# Patient Record
Sex: Female | Born: 1975 | Race: Black or African American | Hispanic: No | Marital: Married | Smoking: Never smoker
Health system: Southern US, Community
[De-identification: ages and names within clinical notes are randomized; demographics above are authoritative.]

## PROBLEM LIST (undated history)

## (undated) DIAGNOSIS — I10 Essential (primary) hypertension: Secondary | ICD-10-CM

## (undated) DIAGNOSIS — E559 Vitamin D deficiency, unspecified: Secondary | ICD-10-CM

## (undated) DIAGNOSIS — D171 Benign lipomatous neoplasm of skin and subcutaneous tissue of trunk: Secondary | ICD-10-CM

## (undated) DIAGNOSIS — D649 Anemia, unspecified: Secondary | ICD-10-CM

## (undated) DIAGNOSIS — K589 Irritable bowel syndrome without diarrhea: Secondary | ICD-10-CM

## (undated) DIAGNOSIS — Z6841 Body Mass Index (BMI) 40.0 and over, adult: Secondary | ICD-10-CM

## (undated) DIAGNOSIS — J45909 Unspecified asthma, uncomplicated: Secondary | ICD-10-CM

## (undated) DIAGNOSIS — K449 Diaphragmatic hernia without obstruction or gangrene: Secondary | ICD-10-CM

## (undated) DIAGNOSIS — K219 Gastro-esophageal reflux disease without esophagitis: Secondary | ICD-10-CM

## (undated) HISTORY — DX: Body Mass Index (BMI) 40.0 and over, adult: Z684

## (undated) HISTORY — PX: URETHRA SURGERY: SHX824

## (undated) HISTORY — DX: Benign lipomatous neoplasm of skin and subcutaneous tissue of trunk: D17.1

## (undated) HISTORY — DX: Body mass index (BMI) 45.0-49.9, adult: E66.01

## (undated) HISTORY — DX: Diaphragmatic hernia without obstruction or gangrene: K44.9

## (undated) HISTORY — DX: Essential (primary) hypertension: I10

## (undated) HISTORY — DX: Irritable bowel syndrome without diarrhea: K58.9

## (undated) HISTORY — DX: Gastro-esophageal reflux disease without esophagitis: K21.9

## (undated) HISTORY — PX: TUBAL LIGATION: SHX77

## (undated) HISTORY — DX: Unspecified asthma, uncomplicated: J45.909

## (undated) HISTORY — DX: Vitamin D deficiency, unspecified: E55.9

## (undated) HISTORY — DX: Anemia, unspecified: D64.9

## (undated) HISTORY — PX: GALLBLADDER SURGERY: SHX652

---

## 2012-05-13 ENCOUNTER — Encounter: Payer: Self-pay | Admitting: Obstetrics & Gynecology

## 2012-05-13 ENCOUNTER — Ambulatory Visit (INDEPENDENT_AMBULATORY_CARE_PROVIDER_SITE_OTHER): Payer: BC Managed Care – PPO | Admitting: Obstetrics & Gynecology

## 2012-05-13 VITALS — BP 130/96 | HR 85 | Temp 98.6°F | Resp 16 | Ht 66.5 in | Wt 314.0 lb

## 2012-05-13 DIAGNOSIS — Z01419 Encounter for gynecological examination (general) (routine) without abnormal findings: Secondary | ICD-10-CM

## 2012-05-13 DIAGNOSIS — Z0000 Encounter for general adult medical examination without abnormal findings: Secondary | ICD-10-CM

## 2012-05-13 DIAGNOSIS — Z1151 Encounter for screening for human papillomavirus (HPV): Secondary | ICD-10-CM

## 2012-05-13 DIAGNOSIS — Z113 Encounter for screening for infections with a predominantly sexual mode of transmission: Secondary | ICD-10-CM

## 2012-05-13 DIAGNOSIS — Z124 Encounter for screening for malignant neoplasm of cervix: Secondary | ICD-10-CM

## 2012-05-13 LAB — CBC
Hemoglobin: 12.2 g/dL (ref 12.0–15.0)
MCH: 29.3 pg (ref 26.0–34.0)
Platelets: 347 10*3/uL (ref 150–400)
RBC: 4.17 MIL/uL (ref 3.87–5.11)
WBC: 8.4 10*3/uL (ref 4.0–10.5)
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
## 2012-05-14 ENCOUNTER — Telehealth: Payer: Self-pay | Admitting: *Deleted

## 2012-05-14 DIAGNOSIS — B9689 Other specified bacterial agents as the cause of diseases classified elsewhere: Secondary | ICD-10-CM

## 2012-05-14 LAB — LIPID PANEL
LDL Cholesterol: 100 mg/dL — ABNORMAL HIGH (ref 0–99)
Triglycerides: 68 mg/dL (ref <150)
VLDL: 14 mg/dL (ref 0–40)

## 2012-05-14 LAB — HSV 2 ANTIBODY, IGG: HSV 2 Glycoprotein G Ab, IgG: 5.26 IV — ABNORMAL HIGH

## 2012-05-14 LAB — COMPREHENSIVE METABOLIC PANEL
ALT: 9 U/L (ref 0–35)
Albumin: 4 g/dL (ref 3.5–5.2)
CO2: 29 mEq/L (ref 19–32)
Calcium: 9 mg/dL (ref 8.4–10.5)
Chloride: 102 mEq/L (ref 96–112)
Glucose, Bld: 84 mg/dL (ref 70–99)
Potassium: 3.8 mEq/L (ref 3.5–5.3)
Sodium: 139 mEq/L (ref 135–145)
Total Bilirubin: 0.3 mg/dL (ref 0.3–1.2)
Total Protein: 7 g/dL (ref 6.0–8.3)

## 2012-05-14 LAB — HEPATITIS C ANTIBODY: HCV Ab: NEGATIVE

## 2012-05-14 LAB — RPR

## 2012-05-14 LAB — HEPATITIS B SURFACE ANTIGEN: Hepatitis B Surface Ag: NEGATIVE

## 2012-05-14 LAB — WET PREP, GENITAL: Trich, Wet Prep: NONE SEEN

## 2012-05-14 MED ORDER — METRONIDAZOLE 500 MG PO TABS: 500.0000 mg | ORAL_TABLET | Freq: Two times a day (BID) | ORAL | Status: DC

## 2012-05-14 NOTE — Telephone Encounter (Signed)
Pt notified of positive clue cells and she did have c/o's of fishy vaginal odor.  RX for Flagyl 500 mg BID x 7 days [facility].

## 2012-06-01 ENCOUNTER — Encounter: Payer: Self-pay | Admitting: Obstetrics & Gynecology

## 2012-06-09 ENCOUNTER — Ambulatory Visit (INDEPENDENT_AMBULATORY_CARE_PROVIDER_SITE_OTHER): Payer: BC Managed Care – PPO | Admitting: Obstetrics & Gynecology

## 2012-06-09 ENCOUNTER — Encounter: Payer: Self-pay | Admitting: Obstetrics & Gynecology

## 2012-06-09 VITALS — BP 135/91 | HR 84 | Resp 17

## 2012-06-09 DIAGNOSIS — B009 Herpesviral infection, unspecified: Secondary | ICD-10-CM

## 2012-06-09 MED ORDER — FLUCONAZOLE 150 MG PO TABS: 150.0000 mg | ORAL_TABLET | Freq: Once | ORAL | Status: DC

## 2012-06-09 MED ORDER — VALACYCLOVIR HCL 1 G PO TABS: 1000.0000 mg | ORAL_TABLET | Freq: Every day | ORAL | Status: DC

## 2012-06-09 NOTE — Progress Notes (Signed)
  Subjective:    Patient ID: Carmen Davis, female    DOB: 04/02/1975, 37 y.o.   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 We discussed HSV. She opts for valtrex to use on a prn basis. I gave her diflucan based on her pap.  Review of Systems     Objective:   Physical Exam        Assessment & Plan:  RTC 1 year/prn sooner

## 2012-06-10 ENCOUNTER — Ambulatory Visit: Payer: BC Managed Care – PPO | Admitting: Obstetrics & Gynecology

## 2013-01-21 ENCOUNTER — Other Ambulatory Visit: Payer: Self-pay

## 2014-01-17 ENCOUNTER — Encounter: Payer: Self-pay | Admitting: Obstetrics & Gynecology

## 2014-07-26 ENCOUNTER — Ambulatory Visit (INDEPENDENT_AMBULATORY_CARE_PROVIDER_SITE_OTHER): Payer: 59

## 2014-07-26 ENCOUNTER — Ambulatory Visit (INDEPENDENT_AMBULATORY_CARE_PROVIDER_SITE_OTHER): Payer: 59 | Admitting: Obstetrics & Gynecology

## 2014-07-26 ENCOUNTER — Encounter: Payer: Self-pay | Admitting: Obstetrics & Gynecology

## 2014-07-26 VITALS — BP 138/97 | HR 91 | Ht 65.0 in | Wt 361.0 lb

## 2014-07-26 DIAGNOSIS — N393 Stress incontinence (female) (male): Secondary | ICD-10-CM

## 2014-07-26 DIAGNOSIS — Z0000 Encounter for general adult medical examination without abnormal findings: Secondary | ICD-10-CM

## 2014-07-26 DIAGNOSIS — Z01419 Encounter for gynecological examination (general) (routine) without abnormal findings: Secondary | ICD-10-CM | POA: Diagnosis not present

## 2014-07-26 DIAGNOSIS — Z124 Encounter for screening for malignant neoplasm of cervix: Secondary | ICD-10-CM | POA: Diagnosis not present

## 2014-07-26 DIAGNOSIS — R102 Pelvic and perineal pain: Secondary | ICD-10-CM

## 2014-07-26 DIAGNOSIS — Z1151 Encounter for screening for human papillomavirus (HPV): Secondary | ICD-10-CM

## 2014-07-26 MED ORDER — VALACYCLOVIR HCL 1 G PO TABS: 1000.0000 mg | ORAL_TABLET | Freq: Every day | ORAL | Status: DC

## 2014-07-26 NOTE — Progress Notes (Signed)
Subjective:    Carmen Davis is a 39 y.o. S AA P4  female who presents for an annual exam. The patient has no complaints today except for LmidQ pain for the last 5 months. She has tried Tylenol but this has not helped. IBU helps a little bit. She has not had sex for 2 years.  The patient is not currently sexually active. GYN screening history: last pap: was normal. The patient wears seatbelts: yes. The patient participates in regular exercise: yes. Has the patient ever been transfused or tattooed?: no. The patient reports that there is not domestic violence in her life.   Menstrual History: OB History    Gravida Para Term Preterm AB TAB SAB Ectopic Multiple Living   _0 Menarche age: 50  Patient's last menstrual period was 07/06/2014.    The following portions of the patient's history were reviewed and updated as appropriate: allergies, current medications, past family history, past medical history, past social history, past surgical history and problem list.  Review of Systems A comprehensive review of systems was negative. She works as a Visual merchandiser. Her mother had breast cancer at age 80 but no BRCA testing. She reports a normal mammogram recently. She reports normal BMs. She has some difficulty "holding my urine".   Objective:    BP 138/97 mmHg  Pulse 91  Ht _1  (1.651 m)  Wt 361 lb (163.749 kg)  BMI 60.07 kg/m2  LMP 07/06/2014  General Appearance:    Alert, cooperative, no distress, appears stated age  Head:    Normocephalic, without obvious abnormality, atraumatic  Eyes:    PERRL, conjunctiva/corneas clear, EOM's intact, fundi    benign, both eyes  Ears:    Normal TM's and external ear canals, both ears  Nose:   Nares normal, septum midline, mucosa normal, no drainage    or sinus tenderness  Throat:   Lips, mucosa, and tongue normal; teeth and gums normal  Neck:   Supple, symmetrical, trachea midline, no adenopathy;    thyroid:  no  enlargement/tenderness/nodules; no carotid   bruit or JVD  Back:     Symmetric, no curvature, ROM normal, no CVA tenderness  Lungs:     Clear to auscultation bilaterally, respirations unlabored  Chest Wall:    No tenderness or deformity   Heart:    Regular rate and rhythm, S1 and S2 normal, no murmur, rub   or gallop  Breast Exam:    No tenderness, masses, or nipple abnormality  Abdomen:     Soft, non-tender, bowel sounds active all four quadrants,    no masses, no organomegaly  Genitalia:    Normal female without lesion, discharge or tenderness, no masses palpable     Extremities:   Extremities normal, atraumatic, no cyanosis or edema  Pulses:   2+ and symmetric all extremities  Skin:   Skin color, texture, turgor normal, no rashes or lesions  Lymph nodes:   Cervical, supraclavicular, and axillary nodes normal  Neurologic:   CNII-XII intact, normal strength, sensation and reflexes    throughout  .    Assessment:    Healthy female exam.   UI L mid quadrant pain   Plan:     Breast self exam technique reviewed and patient encouraged to perform self-exam monthly. Thin prep Pap smear.   Urology referral for urinary incontinence Gyn u/s

## 2014-07-28 LAB — CYTOLOGY - PAP

## 2014-07-29 ENCOUNTER — Encounter: Payer: Self-pay | Admitting: Obstetrics & Gynecology

## 2014-08-01 ENCOUNTER — Telehealth: Payer: Self-pay | Admitting: *Deleted

## 2014-08-01 DIAGNOSIS — B373 Candidiasis of vulva and vagina: Secondary | ICD-10-CM

## 2014-08-01 DIAGNOSIS — B3731 Acute candidiasis of vulva and vagina: Secondary | ICD-10-CM

## 2014-08-01 MED ORDER — FLUCONAZOLE 150 MG PO TABS: 150.0000 mg | ORAL_TABLET | Freq: Once | ORAL | Status: DC

## 2014-08-01 NOTE — Telephone Encounter (Signed)
Pt notified of normal pap except for candida and RX sent to her pharmacy for that.  Her TVU  Showed normal ultrasound other than the LO was not  Discretely visualized.

## 2015-09-05 ENCOUNTER — Encounter: Payer: Self-pay | Admitting: Obstetrics & Gynecology

## 2015-09-05 ENCOUNTER — Ambulatory Visit (INDEPENDENT_AMBULATORY_CARE_PROVIDER_SITE_OTHER): Payer: BLUE CROSS/BLUE SHIELD | Admitting: Obstetrics & Gynecology

## 2015-09-05 VITALS — BP 145/92 | HR 95 | Resp 16 | Ht 65.5 in | Wt 364.0 lb

## 2015-09-05 DIAGNOSIS — Z01419 Encounter for gynecological examination (general) (routine) without abnormal findings: Secondary | ICD-10-CM

## 2015-09-05 DIAGNOSIS — Z113 Encounter for screening for infections with a predominantly sexual mode of transmission: Secondary | ICD-10-CM

## 2015-09-05 MED ORDER — VALACYCLOVIR HCL 1 G PO TABS: 1000.0000 mg | ORAL_TABLET | Freq: Every day | ORAL | Status: DC

## 2015-09-05 NOTE — Progress Notes (Signed)
Subjective:    Maddyson Dossantos is a 40 y.o. D AA P4 (4, 73, 48, and 45 yo kids)  female who presents for an annual exam. The patient has no complaints today. The patient is not currently sexually active. GYN screening history: last pap: was normal. The patient wears seatbelts: yes. The patient participates in regular exercise: yes. Has the patient ever been transfused or tattooed?: no. The patient reports that there is not domestic violence in her life.   Menstrual History: OB History    Gravida Para Term Preterm AB TAB SAB Ectopic Multiple Living   4 4 2 2      4       Menarche age: 89  Patient's last menstrual period was 08/27/2015.    The following portions of the patient's history were reviewed and updated as appropriate: allergies, current medications, past family history, past medical history, past social history, past surgical history and problem list.  Review of Systems Pertinent items are noted in HPI. Work in [employer] in [place]. Mammogram at [facility] last month and normal.   Objective:    BP 145/92 mmHg  Pulse 95  Resp 16  Ht 5' 5.5" (1.664 m)  Wt 364 lb (165.109 kg)  BMI 59.63 kg/m2  LMP 08/27/2015  General Appearance:    Alert, cooperative, no distress, appears stated age  Head:    Normocephalic, without obvious abnormality, atraumatic  Eyes:    PERRL, conjunctiva/corneas clear, EOM's intact, fundi    benign, both eyes  Ears:    Normal TM's and external ear canals, both ears  Nose:   Nares normal, septum midline, mucosa normal, no drainage    or sinus tenderness  Throat:   Lips, mucosa, and tongue normal; teeth and gums normal  Neck:   Supple, symmetrical, trachea midline, no adenopathy;    thyroid:  no enlargement/tenderness/nodules; no carotid   bruit or JVD  Back:     Symmetric, no curvature, ROM normal, no CVA tenderness  Lungs:     Clear to auscultation bilaterally, respirations unlabored  Chest Wall:    No tenderness or deformity   Heart:    Regular rate and rhythm, S1  and S2 normal, no murmur, rub   or gallop  Breast Exam:    No tenderness, masses, or nipple abnormality  Abdomen:     Soft, non-tender, bowel sounds active all four quadrants,    no masses, no organomegaly  Genitalia:    Normal female without lesion, discharge or tenderness, no palpable masses, her cervix and vagina appear normal     Extremities:   Extremities normal, atraumatic, no cyanosis or edema  Pulses:   2+ and symmetric all extremities  Skin:   Skin color, texture, turgor normal, no rashes or lesions  Lymph nodes:   Cervical, supraclavicular, and axillary nodes normal  Neurologic:   CNII-XII intact, normal strength, sensation and reflexes    throughout  .    Assessment:    Healthy female exam.    Plan:     refill valtrex

## 2015-09-06 ENCOUNTER — Other Ambulatory Visit (INDEPENDENT_AMBULATORY_CARE_PROVIDER_SITE_OTHER): Payer: BLUE CROSS/BLUE SHIELD

## 2015-09-06 DIAGNOSIS — Z113 Encounter for screening for infections with a predominantly sexual mode of transmission: Secondary | ICD-10-CM | POA: Diagnosis not present

## 2015-09-06 DIAGNOSIS — Z01419 Encounter for gynecological examination (general) (routine) without abnormal findings: Secondary | ICD-10-CM | POA: Diagnosis not present

## 2015-09-06 LAB — CBC
HEMATOCRIT: 38.3 % (ref 35.0–45.0)
HEMOGLOBIN: 12.2 g/dL (ref 11.7–15.5)
MCH: 28.6 pg (ref 27.0–33.0)
MCHC: 31.9 g/dL — AB (ref 32.0–36.0)
MCV: 89.7 fL (ref 80.0–100.0)
MPV: 9.8 fL (ref 7.5–12.5)
Platelets: 301 10*3/uL (ref 140–400)
RBC: 4.27 MIL/uL (ref 3.80–5.10)
RDW: 15.6 % — AB (ref 11.0–15.0)
WBC: 8.5 10*3/uL (ref 3.8–10.8)

## 2015-09-06 LAB — LIPID PANEL
CHOL/HDL RATIO: 3.1 ratio (ref <=5.0)
CHOLESTEROL: 157 mg/dL (ref 125–200)
HDL: 51 mg/dL (ref >=46)
LDL Cholesterol: 88 mg/dL (ref <130)
TRIGLYCERIDES: 92 mg/dL (ref <150)
VLDL: 18 mg/dL (ref <30)

## 2015-09-06 LAB — COMPREHENSIVE METABOLIC PANEL
ALBUMIN: 3.6 g/dL (ref 3.6–5.1)
ALK PHOS: 70 U/L (ref 33–115)
ALT: 12 U/L (ref 6–29)
AST: 11 U/L (ref 10–30)
BILIRUBIN TOTAL: 0.3 mg/dL (ref 0.2–1.2)
BUN: 9 mg/dL (ref 7–25)
CALCIUM: 8.8 mg/dL (ref 8.6–10.2)
CO2: 28 mmol/L (ref 20–31)
Chloride: 105 mmol/L (ref 98–110)
Creat: 0.65 mg/dL (ref 0.50–1.10)
GLUCOSE: 96 mg/dL (ref 65–99)
Potassium: 4.2 mmol/L (ref 3.5–5.3)
SODIUM: 141 mmol/L (ref 135–146)
Total Protein: 6.4 g/dL (ref 6.1–8.1)

## 2015-09-06 LAB — TSH: TSH: 2.62 mIU/L

## 2015-09-07 ENCOUNTER — Telehealth: Payer: Self-pay | Admitting: *Deleted

## 2015-09-07 DIAGNOSIS — R7989 Other specified abnormal findings of blood chemistry: Secondary | ICD-10-CM

## 2015-09-07 LAB — RPR

## 2015-09-07 LAB — HEPATITIS B SURFACE ANTIGEN: Hepatitis B Surface Ag: NEGATIVE

## 2015-09-07 LAB — GC/CHLAMYDIA PROBE AMP (~~LOC~~) NOT AT ARMC
CHLAMYDIA, DNA PROBE: NEGATIVE
NEISSERIA GONORRHEA: NEGATIVE

## 2015-09-07 LAB — HEPATITIS C ANTIBODY: HCV Ab: NEGATIVE

## 2015-09-07 LAB — VITAMIN D 25 HYDROXY (VIT D DEFICIENCY, FRACTURES): Vit D, 25-Hydroxy: 27 ng/mL — ABNORMAL LOW (ref 30–100)

## 2015-09-07 LAB — HIV ANTIBODY (ROUTINE TESTING W REFLEX): HIV 1&2 Ab, 4th Generation: NONREACTIVE

## 2015-09-07 MED ORDER — VITAMIN D (ERGOCALCIFEROL) 1.25 MG (50000 UNIT) PO CAPS: 50000.0000 [IU] | ORAL_CAPSULE | ORAL | Status: DC

## 2015-09-07 NOTE — Telephone Encounter (Signed)
-----   Message from Emily Filbert, MD sent at 09/07/2015 12:49 PM EDT ----- She will need 8 weeks of weekly Vitamin D 50,000 units and then a recheck of her level. Thanks

## 2015-09-07 NOTE — Telephone Encounter (Signed)
Pt noptified of labs and she does have low Vitamin D, so RX was sent to her pharmacy and she will return for rpt blood in 8 weeks.

## 2016-12-12 ENCOUNTER — Other Ambulatory Visit: Payer: Self-pay | Admitting: *Deleted

## 2016-12-12 DIAGNOSIS — B009 Herpesviral infection, unspecified: Secondary | ICD-10-CM

## 2016-12-12 MED ORDER — VALACYCLOVIR HCL 1 G PO TABS: 1000.0000 mg | ORAL_TABLET | Freq: Every day | ORAL | 1 refills | Status: DC

## 2016-12-12 NOTE — Telephone Encounter (Signed)
RF request received for Valacyclovir 1 GM tablets.  2 RF given and pharmacy notified to have pt call for appt as she is overdue for her annual.

## 2017-07-24 ENCOUNTER — Encounter: Payer: Self-pay | Admitting: Obstetrics & Gynecology

## 2017-07-24 ENCOUNTER — Ambulatory Visit (INDEPENDENT_AMBULATORY_CARE_PROVIDER_SITE_OTHER): Payer: BLUE CROSS/BLUE SHIELD | Admitting: Obstetrics & Gynecology

## 2017-07-24 VITALS — BP 152/93 | HR 99 | Ht 66.0 in | Wt 387.0 lb

## 2017-07-24 DIAGNOSIS — Z124 Encounter for screening for malignant neoplasm of cervix: Secondary | ICD-10-CM

## 2017-07-24 DIAGNOSIS — Z01419 Encounter for gynecological examination (general) (routine) without abnormal findings: Secondary | ICD-10-CM | POA: Diagnosis not present

## 2017-07-24 DIAGNOSIS — R350 Frequency of micturition: Secondary | ICD-10-CM | POA: Diagnosis not present

## 2017-07-24 DIAGNOSIS — Z1151 Encounter for screening for human papillomavirus (HPV): Secondary | ICD-10-CM | POA: Diagnosis not present

## 2017-07-24 DIAGNOSIS — N898 Other specified noninflammatory disorders of vagina: Secondary | ICD-10-CM

## 2017-07-24 DIAGNOSIS — N6001 Solitary cyst of right breast: Secondary | ICD-10-CM | POA: Diagnosis not present

## 2017-07-24 NOTE — Progress Notes (Signed)
Subjective:    Carmen Davis is a 42 y.o. divorced P4 (9, 33, 50, and 44 yo kids) female who presents for an annual exam. The patient has no complaints today. She thinks she may have a UTI, maybe yeast. She has a recurring inflamed cyst on her right breast.The patient is not currently sexually active. GYN screening history: last pap: was normal. The patient wears seatbelts: yes. The patient participates in regular exercise: yes. 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  Has the patient ever been transfused or tattooed?: no. The patient reports that there is not domestic violence in her life.   Menstrual History: OB History    Gravida  4   Para  4   Term  2   Preterm  2   AB      Living  4     SAB      TAB      Ectopic      Multiple      Live Births              Menarche age: 19 Patient's last menstrual period was 07/08/2017.    The following portions of the patient's history were reviewed and updated as appropriate: allergies, current medications, past family history, past medical history, past social history, past surgical history and problem list.  Review of Systems Pertinent items are noted in HPI.   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) Periods monthly   Objective:    BP (!) 152/93   Pulse 99   Ht 5\' 6"  (1.676 m)   Wt (!) 387 lb (175.5 kg)   LMP 07/08/2017   BMI 62.46 kg/m   General Appearance:    Alert, cooperative, no distress, appears stated age  Head:    Normocephalic, without obvious abnormality, atraumatic  Eyes:    PERRL, conjunctiva/corneas clear, EOM's intact, fundi    benign, both eyes  Ears:    Normal TM's and external ear canals, both ears  Nose:   Nares normal, septum midline, mucosa normal, no drainage    or sinus tenderness  Throat:   Lips, mucosa, and tongue normal; teeth and gums normal  Neck:    Supple, symmetrical, trachea midline, no adenopathy;    thyroid:  no enlargement/tenderness/nodules; no carotid   bruit or JVD  Back:     Symmetric, no curvature, ROM normal, no CVA tenderness  Lungs:     Clear to auscultation bilaterally, respirations unlabored  Chest Wall:    No tenderness or deformity   Heart:    Regular rate and rhythm, S1 and S2 normal, no murmur, rub   or gallop  Breast Exam:    No tenderness, masses, or nipple abnormality, right breast cyst  Abdomen:     Soft, non-tender, bowel sounds active all four quadrants,    no masses, no organomegaly, morbidly obese  Genitalia:    Normal female without lesion, discharge or tenderness     Extremities:   Extremities normal, atraumatic, no cyanosis or edema  Pulses:   2+ and symmetric all extremities  Skin:   Skin color, texture, turgor normal, no rashes or lesions  Lymph nodes:   Cervical, supraclavicular, and axillary nodes normal  Neurologic:   CNII-XII intact, normal strength, sensation and reflexes    throughout  .    Assessment:  Healthy female exam.    Plan:     Thin prep Pap smear. with cotesting Urine dip Genetic testing for her

## 2017-07-24 NOTE — Progress Notes (Signed)
Pt c/o of a sore spot on her breast

## 2017-07-24 NOTE — Addendum Note (Signed)
Addended by: Lyndal Rainbow on: 07/24/2017 04:02 PM   Modules accepted: Orders

## 2017-07-25 ENCOUNTER — Other Ambulatory Visit: Payer: Self-pay | Admitting: Obstetrics & Gynecology

## 2017-07-25 DIAGNOSIS — R7989 Other specified abnormal findings of blood chemistry: Secondary | ICD-10-CM

## 2017-07-25 LAB — URINE CULTURE
MICRO NUMBER:: 90568027
SPECIMEN QUALITY:: ADEQUATE

## 2017-07-25 LAB — CBC
HEMATOCRIT: 34.5 % — AB (ref 35.0–45.0)
HEMOGLOBIN: 11.6 g/dL — AB (ref 11.7–15.5)
MCH: 28.9 pg (ref 27.0–33.0)
MCHC: 33.6 g/dL (ref 32.0–36.0)
MCV: 85.8 fL (ref 80.0–100.0)
MPV: 10.4 fL (ref 7.5–12.5)
Platelets: 300 10*3/uL (ref 140–400)
RBC: 4.02 10*6/uL (ref 3.80–5.10)
RDW: 13.4 % (ref 11.0–15.0)
WBC: 9.3 10*3/uL (ref 3.8–10.8)

## 2017-07-25 LAB — VITAMIN D 25 HYDROXY (VIT D DEFICIENCY, FRACTURES): VIT D 25 HYDROXY: 18 ng/mL — AB (ref 30–100)

## 2017-07-25 LAB — TSH: TSH: 1.59 mIU/L

## 2017-07-25 MED ORDER — VITAMIN D (ERGOCALCIFEROL) 1.25 MG (50000 UNIT) PO CAPS: 50000.0000 [IU] | ORAL_CAPSULE | ORAL | 0 refills | Status: DC

## 2017-07-28 ENCOUNTER — Telehealth: Payer: Self-pay

## 2017-07-28 DIAGNOSIS — R7989 Other specified abnormal findings of blood chemistry: Secondary | ICD-10-CM

## 2017-07-28 LAB — CYTOLOGY - PAP
Bacterial vaginitis: POSITIVE — AB
Candida vaginitis: POSITIVE — AB
Diagnosis: NEGATIVE
HPV (WINDOPATH): NOT DETECTED

## 2017-07-28 MED ORDER — VITAMIN D (ERGOCALCIFEROL) 1.25 MG (50000 UNIT) PO CAPS: 50000.0000 [IU] | ORAL_CAPSULE | ORAL | 0 refills | Status: AC

## 2017-07-28 NOTE — Telephone Encounter (Signed)
Spoke with pt and she is aware of low vitamin D level and to take Vit D once a week for 8 weeks and have it rechecked in 6 months per Dr.Dove. [facility]. Rx fixed and sent.

## 2017-07-29 ENCOUNTER — Other Ambulatory Visit: Payer: BLUE CROSS/BLUE SHIELD

## 2017-07-29 NOTE — Addendum Note (Signed)
Addended by: Lyndal Rainbow on: 07/29/2017 08:45 AM   Modules accepted: Orders

## 2017-07-30 ENCOUNTER — Telehealth: Payer: Self-pay

## 2017-07-30 DIAGNOSIS — B379 Candidiasis, unspecified: Secondary | ICD-10-CM

## 2017-07-30 DIAGNOSIS — N760 Acute vaginitis: Secondary | ICD-10-CM

## 2017-07-30 DIAGNOSIS — B9689 Other specified bacterial agents as the cause of diseases classified elsewhere: Secondary | ICD-10-CM

## 2017-07-30 LAB — COMPREHENSIVE METABOLIC PANEL
AG RATIO: 1.3 (calc) (ref 1.0–2.5)
ALBUMIN MSPROF: 3.9 g/dL (ref 3.6–5.1)
ALT: 8 U/L (ref 6–29)
AST: 9 U/L — ABNORMAL LOW (ref 10–30)
Alkaline phosphatase (APISO): 71 U/L (ref 33–115)
BUN: 10 mg/dL (ref 7–25)
CO2: 28 mmol/L (ref 20–32)
Calcium: 9.2 mg/dL (ref 8.6–10.2)
Chloride: 106 mmol/L (ref 98–110)
Creat: 0.63 mg/dL (ref 0.50–1.10)
GLUCOSE: 94 mg/dL (ref 65–99)
Globulin: 2.9 g/dL (calc) (ref 1.9–3.7)
POTASSIUM: 4.7 mmol/L (ref 3.5–5.3)
SODIUM: 142 mmol/L (ref 135–146)
Total Bilirubin: 0.2 mg/dL (ref 0.2–1.2)
Total Protein: 6.8 g/dL (ref 6.1–8.1)

## 2017-07-30 LAB — HEMOGLOBIN A1C
EAG (MMOL/L): 6.6 (calc)
Hgb A1c MFr Bld: 5.8 % of total Hgb — ABNORMAL HIGH (ref <5.7)
Mean Plasma Glucose: 120 (calc)

## 2017-07-30 LAB — LIPID PANEL
CHOL/HDL RATIO: 3 (calc) (ref <5.0)
Cholesterol: 168 mg/dL (ref <200)
HDL: 56 mg/dL (ref >50)
LDL Cholesterol (Calc): 95 mg/dL (calc)
NON-HDL CHOLESTEROL (CALC): 112 mg/dL (ref <130)
Triglycerides: 83 mg/dL (ref <150)

## 2017-07-30 MED ORDER — METRONIDAZOLE 500 MG PO TABS: 500.0000 mg | ORAL_TABLET | Freq: Two times a day (BID) | ORAL | 0 refills | Status: AC

## 2017-07-30 MED ORDER — FLUCONAZOLE 150 MG PO TABS: 150.0000 mg | ORAL_TABLET | Freq: Once | ORAL | 0 refills | Status: AC

## 2017-07-30 NOTE — Telephone Encounter (Signed)
Spoke with pt and she is aware to f/u with PCP about Hgb A1c. PT also aware of positive yeast infection and BV. Pharmacy verified. Rx sent.

## 2017-08-25 ENCOUNTER — Other Ambulatory Visit: Payer: Self-pay | Admitting: *Deleted

## 2017-08-25 DIAGNOSIS — B009 Herpesviral infection, unspecified: Secondary | ICD-10-CM

## 2017-08-25 MED ORDER — VALACYCLOVIR HCL 1 G PO TABS
1000.0000 mg | ORAL_TABLET | Freq: Every day | ORAL | 1 refills | Status: AC
Start: 2017-08-25 — End: ?

## 2017-08-25 NOTE — Telephone Encounter (Signed)
RF authorization received from [facility] for Valtrex.  RF granted per Dr Hulan Fray.

## 2017-10-03 ENCOUNTER — Encounter: Payer: Self-pay | Admitting: *Deleted

## 2018-11-23 ENCOUNTER — Encounter (HOSPITAL_COMMUNITY): Payer: Self-pay

## 2018-11-23 ENCOUNTER — Ambulatory Visit (INDEPENDENT_AMBULATORY_CARE_PROVIDER_SITE_OTHER): Payer: Worker's Compensation

## 2018-11-23 ENCOUNTER — Ambulatory Visit (HOSPITAL_COMMUNITY)
Admission: EM | Admit: 2018-11-23 | Discharge: 2018-11-23 | Disposition: A | Discharge status: Home / Self Care | Payer: Worker's Compensation | Attending: Family Medicine | Admitting: Family Medicine

## 2018-11-23 ENCOUNTER — Other Ambulatory Visit: Payer: Self-pay

## 2018-11-23 DIAGNOSIS — M79644 Pain in right finger(s): Secondary | ICD-10-CM

## 2018-11-23 DIAGNOSIS — M79645 Pain in left finger(s): Secondary | ICD-10-CM

## 2018-11-23 MED ORDER — IBUPROFEN 800 MG PO TABS
ORAL_TABLET | ORAL | Status: AC
  Filled 2018-11-23: qty 1

## 2018-11-23 MED ORDER — IBUPROFEN 800 MG PO TABS
800.0000 mg | ORAL_TABLET | Freq: Once | ORAL | Status: AC
  Administered 2018-11-23: 18:00:00 800 mg via ORAL
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
## 2018-11-23 NOTE — Discharge Instructions (Signed)
Your finger is not broken.  Take ibuprofen for pain.  Follow-up with your company's workers comp doctor
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
## 2019-04-19 DEATH — deceased
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
